# Patient Record
Sex: Male | Born: 1960 | Race: White | Hispanic: No | Marital: Married | Smoking: Never smoker
Health system: Southern US, Community
[De-identification: ages and names within clinical notes are randomized; demographics above are authoritative.]

---

## 2005-07-02 ENCOUNTER — Ambulatory Visit: Payer: Self-pay

## 2012-06-09 ENCOUNTER — Ambulatory Visit: Payer: Self-pay | Admitting: Otolaryngology

## 2013-04-20 IMAGING — CT CT PARANASAL SINUSES W/O CM
1 series · 16 of 30 positions shown, 20 images · non-contrast
Comparison: none

REASON FOR EXAM: Chronic Sinusitis
COMMENTS:

[Series 2: osteo · axial · 0.31mm/px · z∈[-187,-23]mm · 16 of 178 slices shown, 20 images]
[im 7/178  brain]
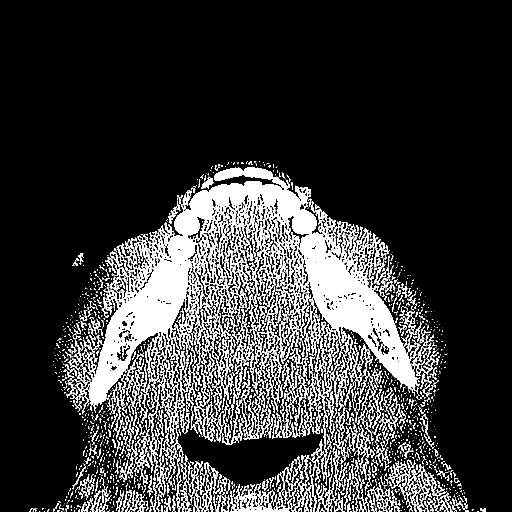
[im 7/178  bone]
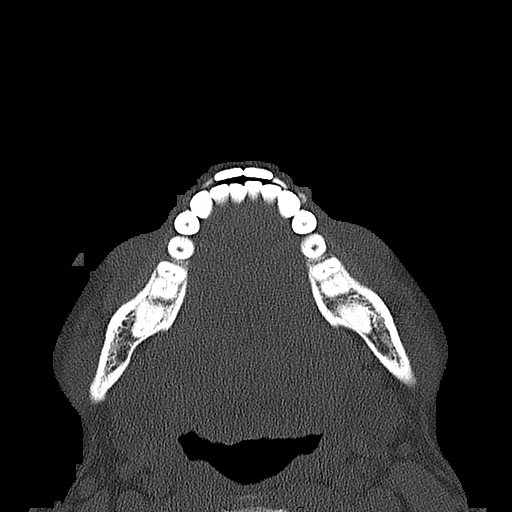
[im 19/178  bone]
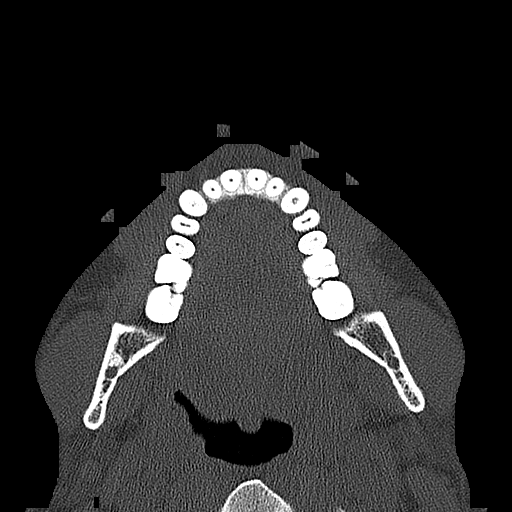
[im 31/178  bone]
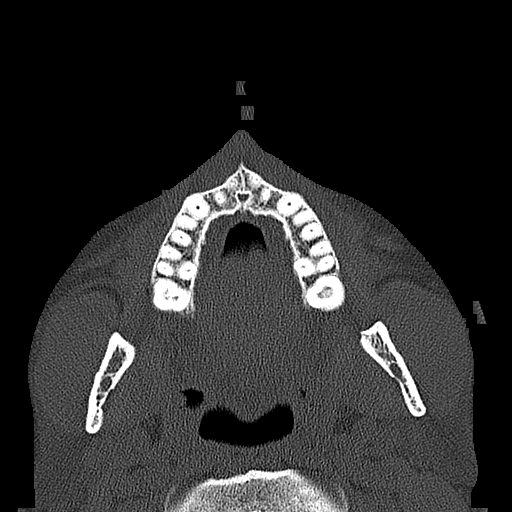
[im 43/178  bone]
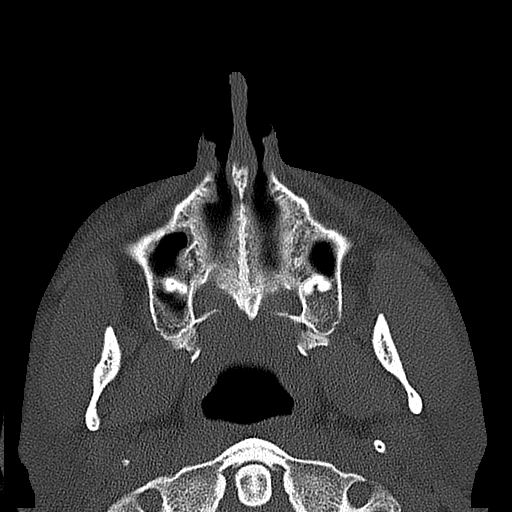
[im 49/178  brain]
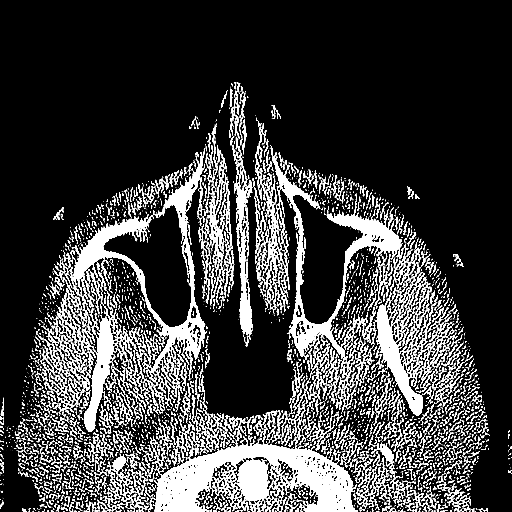
[im 49/178  bone]
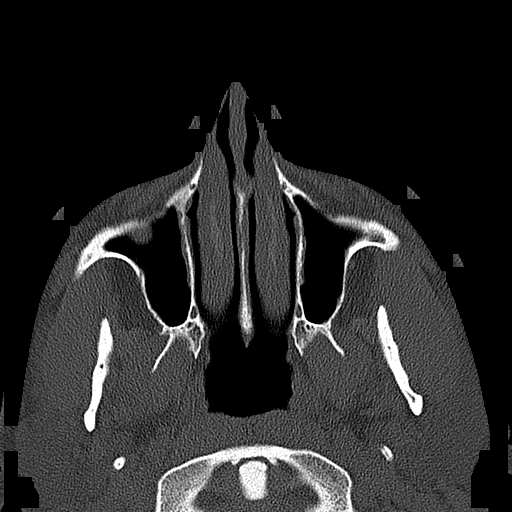
[im 62/178  bone]
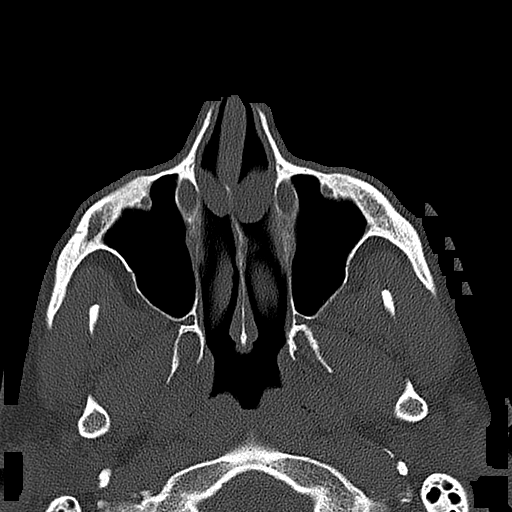
[im 74/178  bone]
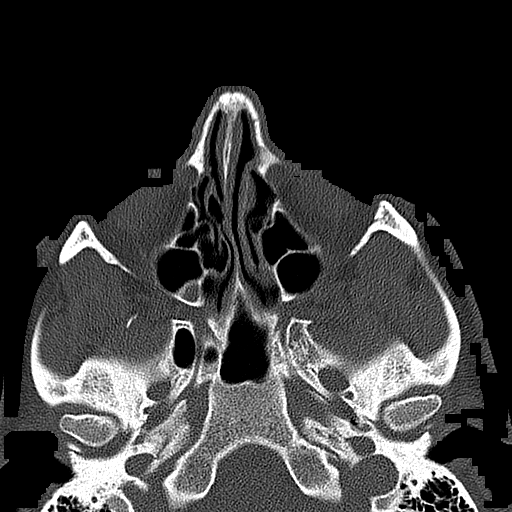
[im 86/178  bone]
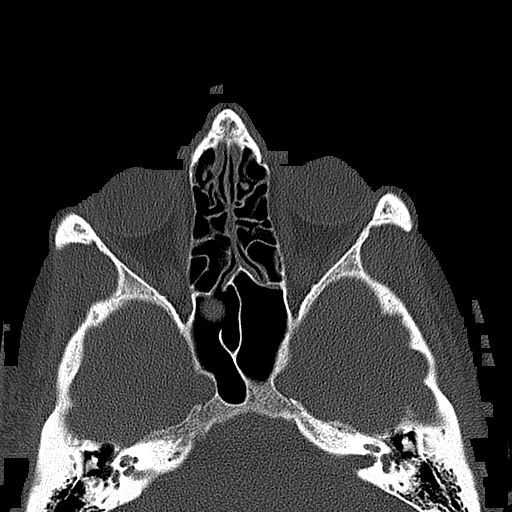
[im 92/178  brain]
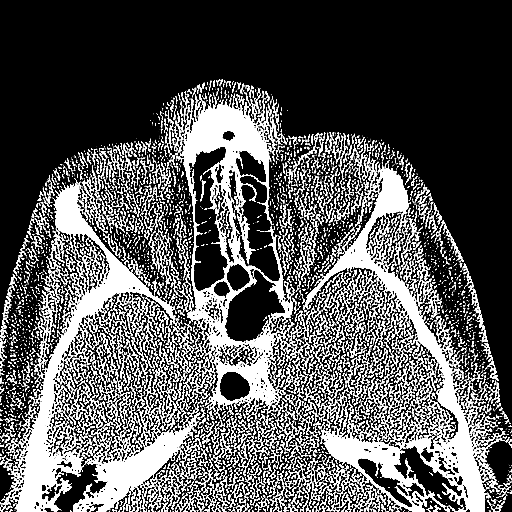
[im 92/178  bone]
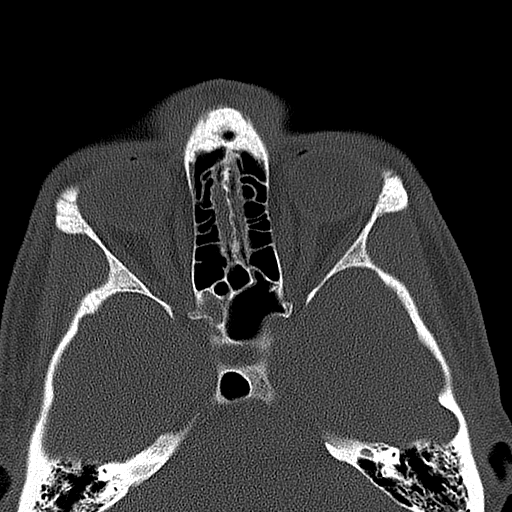
[im 104/178  bone]
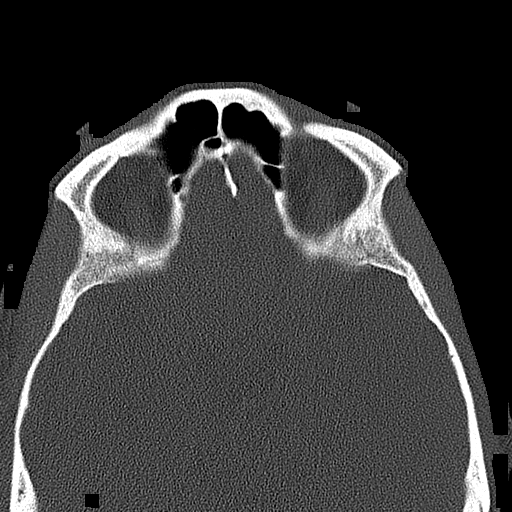
[im 116/178  bone]
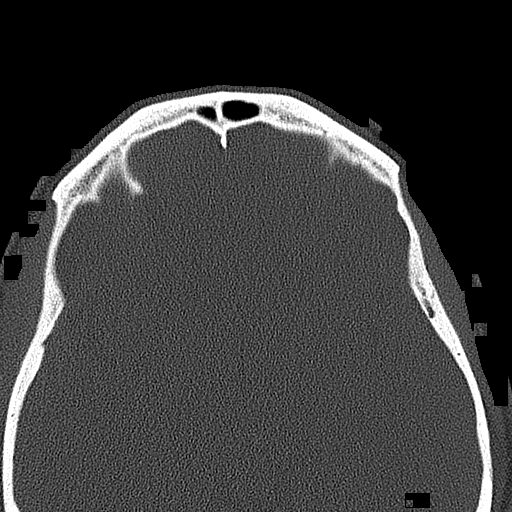
[im 129/178  bone]
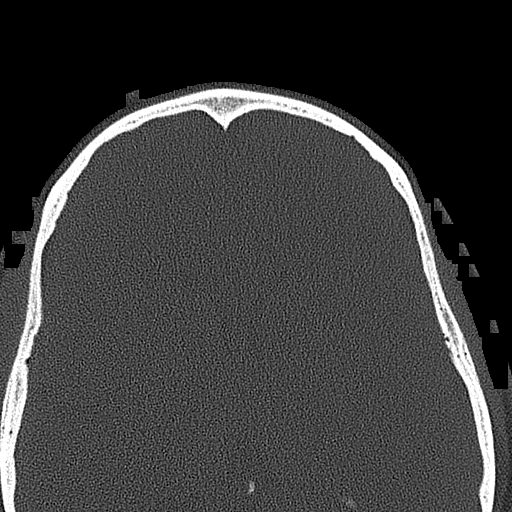
[im 135/178  brain]
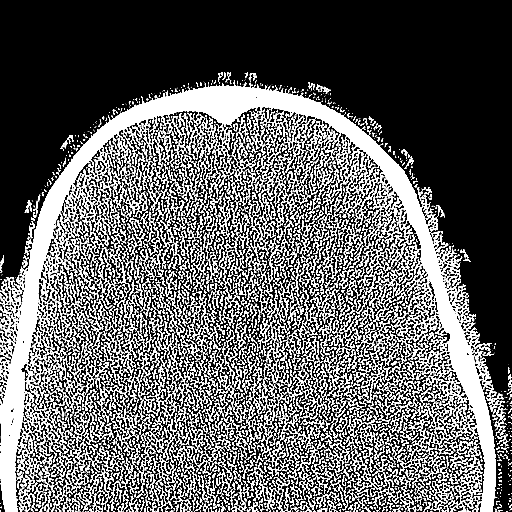
[im 135/178  bone]
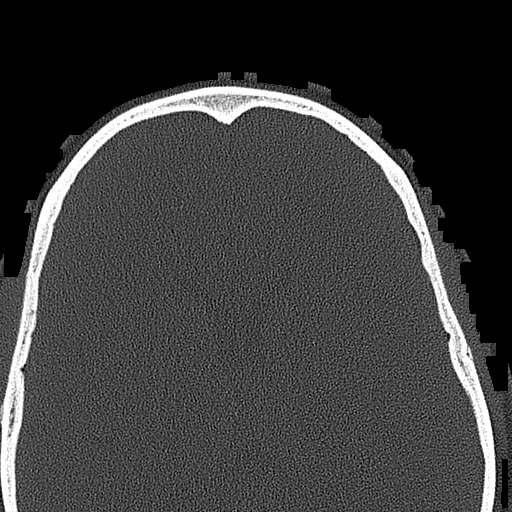
[im 147/178  bone]
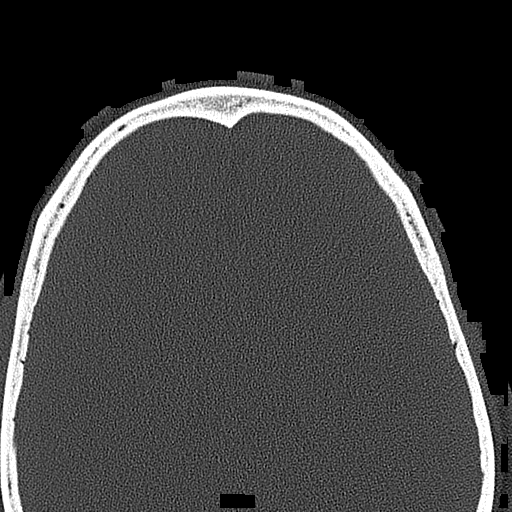
[im 159/178  bone]
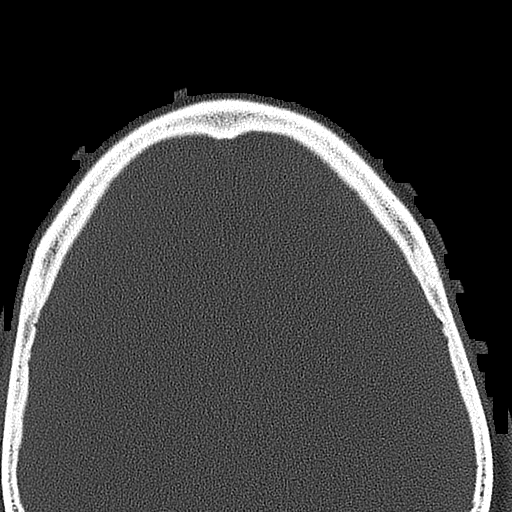
[im 171/178  bone]
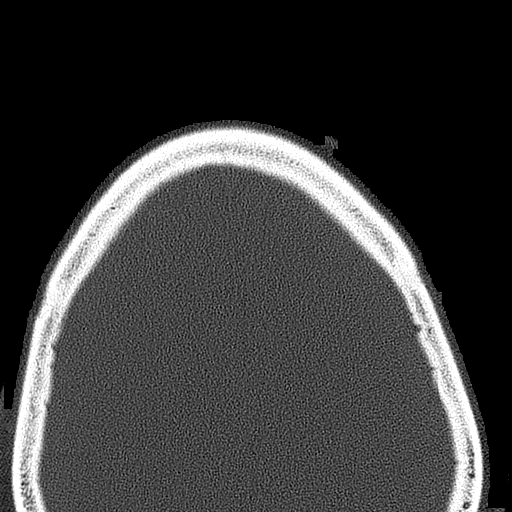

[16 of 30 positions shown; findings below may reference images not displayed]

PROCEDURE:     KCT - KCT SINUSES WITHOUT CONTRAST  - June 09, 2012  [DATE]

RESULT:     Axial CT scanning was performed through the paranasal sinuses
using a bone algorithm with reconstructions at 1 mm intervals and slice
thicknesses. Coronal reconstructions were obtained as well.

The frontal sinuses are well pneumatized and clear. The ethmoid sinuses are
also clear. There is likely a retention cyst or polyp in a right sphenoid
sinus cell measuring 1 cm in diameter. There is soft tissue density in the
right maxillary sinus most compatible with retention cysts or polyps. There
are no air-fluid levels. The nasal septum is slightly deviated toward the
right. The nasal passages are patent.
IMPRESSION: There are soft tissue masses in the right sphenoid and
right maxillary sinuses most compatible with retention cysts or polyps.
There are no air-fluid levels. The ostiomeatal units of the maxillary
sinuses are patent.

[REDACTED]

## 2017-11-03 ENCOUNTER — Ambulatory Visit: Admit: 2017-11-03 | Payer: Self-pay | Admitting: Gastroenterology

## 2018-02-02 ENCOUNTER — Ambulatory Visit: Payer: Managed Care, Other (non HMO) | Admitting: Certified Registered Nurse Anesthetist

## 2018-02-02 ENCOUNTER — Ambulatory Visit
Admission: RE | Admit: 2018-02-02 | Discharge: 2018-02-02 | Disposition: A | Discharge status: Home / Self Care | Payer: Managed Care, Other (non HMO) | Source: Ambulatory Visit | Attending: Gastroenterology | Admitting: Gastroenterology

## 2018-02-02 ENCOUNTER — Encounter
Admission: RE | Disposition: A | Discharge status: Home / Self Care | Payer: Self-pay | Source: Ambulatory Visit | Attending: Gastroenterology

## 2018-02-02 ENCOUNTER — Encounter: Payer: Self-pay | Admitting: *Deleted

## 2018-02-02 DIAGNOSIS — K5730 Diverticulosis of large intestine without perforation or abscess without bleeding: Secondary | ICD-10-CM | POA: Diagnosis not present

## 2018-02-02 DIAGNOSIS — K621 Rectal polyp: Secondary | ICD-10-CM | POA: Insufficient documentation

## 2018-02-02 DIAGNOSIS — K648 Other hemorrhoids: Secondary | ICD-10-CM | POA: Diagnosis not present

## 2018-02-02 DIAGNOSIS — K625 Hemorrhage of anus and rectum: Secondary | ICD-10-CM | POA: Diagnosis present

## 2018-02-02 DIAGNOSIS — D122 Benign neoplasm of ascending colon: Secondary | ICD-10-CM | POA: Diagnosis not present

## 2018-02-02 HISTORY — PX: COLONOSCOPY WITH PROPOFOL: SHX5780

## 2018-02-02 SURGERY — COLONOSCOPY WITH PROPOFOL
Anesthesia: General

## 2018-02-02 MED ORDER — PROPOFOL 10 MG/ML IV BOLUS
INTRAVENOUS | Status: DC | PRN
  Administered 2018-02-02: 28 mg via INTRAVENOUS
  Administered 2018-02-02: 100 mg via INTRAVENOUS
  Administered 2018-02-02: 28 mg via INTRAVENOUS

## 2018-02-02 MED ORDER — MIDAZOLAM HCL 2 MG/2ML IJ SOLN
INTRAMUSCULAR | Status: AC
  Filled 2018-02-02: qty 2

## 2018-02-02 MED ORDER — MIDAZOLAM HCL 2 MG/2ML IJ SOLN
INTRAMUSCULAR | Status: DC | PRN
  Administered 2018-02-02: 2 mg via INTRAVENOUS

## 2018-02-02 MED ORDER — PROPOFOL 500 MG/50ML IV EMUL
INTRAVENOUS | Status: AC
  Filled 2018-02-02: qty 50

## 2018-02-02 MED ORDER — LIDOCAINE HCL (PF) 2 % IJ SOLN
INTRAMUSCULAR | Status: AC
  Filled 2018-02-02: qty 10

## 2018-02-02 MED ORDER — PROPOFOL 500 MG/50ML IV EMUL
INTRAVENOUS | Status: DC | PRN
  Administered 2018-02-02: 140 ug/kg/min via INTRAVENOUS

## 2018-02-02 MED ORDER — LIDOCAINE HCL (CARDIAC) PF 100 MG/5ML IV SOSY
PREFILLED_SYRINGE | INTRAVENOUS | Status: DC | PRN
  Administered 2018-02-02: 50 mg via INTRAVENOUS

## 2018-02-02 MED ORDER — SODIUM CHLORIDE 0.9 % IV SOLN
INTRAVENOUS | Status: DC
  Administered 2018-02-02: 1000 mL via INTRAVENOUS

## 2018-02-02 NOTE — H&P (Signed)
Outpatient short stay form Pre-procedure 02/02/2018 11:10 AM Lollie Sails MD  Primary Physician: Emily Filbert, MD  Reason for visit: Colonoscopy  History of present illness: Patient is a 57 year old male presenting today for a colonoscopy.  He has had some intermittent rectal bleeding for at least a year.  Feels this is likely hemorrhoidal or right at the bottom.  Tolerated his prep well.  This is his first colonoscopy.  Takes no aspirin or blood thinning agent.    Current Facility-Administered Medications:  .  0.9 %  sodium chloride infusion, , Intravenous, Continuous, Lollie Sails, MD, Last Rate: 20 mL/hr at 02/02/18 1021, 1,000 mL at 02/02/18 1021  No medications prior to admission.     Not on File   History reviewed. No pertinent past medical history.  Review of systems:      Physical Exam    Heart and lungs: Regular rate and rhythm without rub or gallop, lungs are bilaterally clear.    HEENT: Normal cephalic atraumatic eyes are anicteric    Other:    Pertinant exam for procedure: Soft nontender nondistended bowel sounds positive normoactive    Planned proceedures: Colonoscopy and indicated procedures. I have discussed the risks benefits and complications of procedures to include not limited to bleeding, infection, perforation and the risk of sedation and the patient wishes to proceed.    Lollie Sails, MD Gastroenterology 02/02/2018  11:10 AM

## 2018-02-02 NOTE — Anesthesia Preprocedure Evaluation (Signed)
Anesthesia Evaluation  Patient identified by MRN, date of birth, ID band Patient awake    Reviewed: Allergy & Precautions, NPO status , Patient's Chart, lab work & pertinent test results  Airway Mallampati: I  TM Distance: >3 FB     Dental  (+) Teeth Intact   Pulmonary neg pulmonary ROS,    Pulmonary exam normal        Cardiovascular negative cardio ROS Normal cardiovascular exam     Neuro/Psych negative neurological ROS  negative psych ROS   GI/Hepatic negative GI ROS, Neg liver ROS,   Endo/Other  negative endocrine ROS  Renal/GU negative Renal ROS  negative genitourinary   Musculoskeletal negative musculoskeletal ROS (+)   Abdominal Normal abdominal exam  (+)   Peds negative pediatric ROS (+)  Hematology negative hematology ROS (+)   Anesthesia Other Findings   Reproductive/Obstetrics                             Anesthesia Physical Anesthesia Plan  ASA: I  Anesthesia Plan: General   Post-op Pain Management:    Induction: Intravenous  PONV Risk Score and Plan:   Airway Management Planned: Nasal Cannula  Additional Equipment:   Intra-op Plan:   Post-operative Plan:   Informed Consent: I have reviewed the patients History and Physical, chart, labs and discussed the procedure including the risks, benefits and alternatives for the proposed anesthesia with the patient or authorized representative who has indicated his/her understanding and acceptance.   Dental advisory given  Plan Discussed with: CRNA and Surgeon  Anesthesia Plan Comments:         Anesthesia Quick Evaluation

## 2018-02-02 NOTE — Op Note (Signed)
[facility] Gastroenterology Patient Name: Michael Maynard Procedure Date: 02/02/2018 11:14 AM MRN: [id number] Account #: [id number] Date of Birth: [birth year] Admit Type: Outpatient Age: 57 Room: [facility] ENDO ROOM 3 Gender: Male Note Status: Finalized Procedure:            Colonoscopy Indications:          Rectal bleeding Providers:            Lollie Sails, MD Referring MD:         Rusty Aus, MD (Referring MD) Medicines:            Monitored Anesthesia Care Complications:        No immediate complications. Procedure:            Pre-Anesthesia Assessment:                       - ASA Grade Assessment: I - A normal, healthy patient.                       After obtaining informed consent, the colonoscope was                        passed under direct vision. Throughout the procedure,                        the patient's blood pressure, pulse, and oxygen                        saturations were monitored continuously. The                        Colonoscope was introduced through the anus and                        advanced to the the cecum, identified by appendiceal                        orifice and ileocecal valve. The colonoscopy was                        performed with moderate difficulty due to a tortuous                        colon. Successful completion of the procedure was aided                        by using manual pressure. Findings:      A few small-mouthed diverticula were found in the sigmoid colon.      A 4 mm polyp was found in the rectum. The polyp was pedunculated. The       polyp was removed with a cold snare. Resection and retrieval were       complete.      A 3 mm polyp was found in the ascending colon. The polyp was sessile.       The polyp was removed with a cold biopsy forceps. Resection and       retrieval were complete.      Non-bleeding internal hemorrhoids were found during retroflexion. The       hemorrhoids were small.      The  exam was otherwise  without abnormality.      The digital rectal exam was normal. Impression:           - Diverticulosis in the sigmoid colon.                       - One 4 mm polyp in the rectum, removed with a cold                        snare. Resected and retrieved.                       - One 3 mm polyp in the ascending colon, removed with a                        cold biopsy forceps. Resected and retrieved.                       - Non-bleeding internal hemorrhoids.                       - The examination was otherwise normal. Recommendation:       - Await pathology results.                       - Telephone GI clinic for pathology results in 1 week. Procedure Code(s):    --- Professional ---                       [phone], Colonoscopy, flexible; with removal of tumor(s),                        polyp(s), or other lesion(s) by snare technique                       [id number], 73, Colonoscopy, flexible; with biopsy, single                        or multiple Diagnosis Code(s):    --- Professional ---                       K64.8, Other hemorrhoids                       K62.1, Rectal polyp                       D12.2, Benign neoplasm of ascending colon                       K62.5, Hemorrhage of anus and rectum                       K57.30, Diverticulosis of large intestine without                        perforation or abscess without bleeding CPT copyright 2017 [employer]. All rights reserved. The codes documented in this report are preliminary and upon coder review may  be revised to meet current compliance requirements. Lollie Sails, MD 02/02/2018 11:55:41 AM This report has been signed electronically. Number of Addenda: 0 Note Initiated On: 02/02/2018 11:14 AM Scope Withdrawal Time: 0 hours  11 minutes 53 seconds  Total Procedure Duration: 0 hours 28 minutes 17 seconds       [facility]

## 2018-02-02 NOTE — Anesthesia Post-op Follow-up Note (Signed)
Anesthesia QCDR form completed.        

## 2018-02-02 NOTE — Transfer of Care (Signed)
Immediate Anesthesia Transfer of Care Note  Patient: Michael Maynard  Procedure(s) Performed: COLONOSCOPY WITH PROPOFOL (N/A )  Patient Location: PACU and Endoscopy Unit  Anesthesia Type:General  Level of Consciousness: drowsy  Airway & Oxygen Therapy: Patient Spontanous Breathing and Patient connected to nasal cannula oxygen  Post-op Assessment: Report given to RN and Post -op Vital signs reviewed and stable  Post vital signs: Reviewed and stable  Last Vitals:  Vitals Value Taken Time  BP    Temp    Pulse 84 02/02/2018 11:57 AM  Resp 17 02/02/2018 11:57 AM  SpO2 97 % 02/02/2018 11:57 AM  Vitals shown include unvalidated device data.  Last Pain:  Vitals:   02/02/18 1000  TempSrc: Tympanic  PainSc: 0-No pain         Complications: No apparent anesthesia complications

## 2018-02-03 ENCOUNTER — Encounter: Payer: Self-pay | Admitting: Gastroenterology

## 2018-02-03 LAB — SURGICAL PATHOLOGY

## 2018-02-03 NOTE — Anesthesia Postprocedure Evaluation (Signed)
Anesthesia Post Note  Patient: Michael Maynard  Procedure(s) Performed: COLONOSCOPY WITH PROPOFOL (N/A )  Patient location during evaluation: PACU Anesthesia Type: General Level of consciousness: awake and alert and oriented Pain management: pain level controlled Vital Signs Assessment: post-procedure vital signs reviewed and stable Respiratory status: spontaneous breathing Cardiovascular status: blood pressure returned to baseline Anesthetic complications: no     Last Vitals:  Vitals:   02/02/18 1229 02/02/18 1230  BP: 103/77   Pulse: 76 85  Resp: 20 15  Temp:    SpO2: 98% 98%    Last Pain:  Vitals:   02/03/18 0740  TempSrc:   PainSc: 0-No pain                 Margareth Kanner

## 2023-10-17 ENCOUNTER — Ambulatory Visit (INDEPENDENT_AMBULATORY_CARE_PROVIDER_SITE_OTHER): Payer: Self-pay

## 2023-10-17 DIAGNOSIS — Z860101 Personal history of adenomatous and serrated colon polyps: Secondary | ICD-10-CM | POA: Diagnosis not present

## 2023-10-17 DIAGNOSIS — Z09 Encounter for follow-up examination after completed treatment for conditions other than malignant neoplasm: Secondary | ICD-10-CM | POA: Diagnosis present
